# Patient Record
Sex: Female | Born: 1985 | Race: White | Hispanic: No | Marital: Married | Smoking: Never smoker
Health system: Southern US, Community
[De-identification: ages and names within clinical notes are randomized; demographics above are authoritative.]

---

## 2014-08-02 ENCOUNTER — Emergency Department (INDEPENDENT_AMBULATORY_CARE_PROVIDER_SITE_OTHER)
Admission: EM | Admit: 2014-08-02 | Discharge: 2014-08-02 | Disposition: A | Discharge status: Home / Self Care | Payer: No Typology Code available for payment source | Source: Home / Self Care | Attending: Family Medicine | Admitting: Family Medicine

## 2014-08-02 ENCOUNTER — Emergency Department (INDEPENDENT_AMBULATORY_CARE_PROVIDER_SITE_OTHER): Payer: No Typology Code available for payment source

## 2014-08-02 ENCOUNTER — Encounter: Payer: Self-pay | Admitting: Emergency Medicine

## 2014-08-02 DIAGNOSIS — S39012A Strain of muscle, fascia and tendon of lower back, initial encounter: Secondary | ICD-10-CM

## 2014-08-02 DIAGNOSIS — S335XXA Sprain of ligaments of lumbar spine, initial encounter: Secondary | ICD-10-CM

## 2014-08-02 DIAGNOSIS — M545 Low back pain, unspecified: Secondary | ICD-10-CM

## 2014-08-02 NOTE — ED Provider Notes (Signed)
CSN: [id number]     Arrival date & time 08/02/14  1748 History   First MD Initiated Contact with Patient 08/02/14 1808     Chief Complaint  Patient presents with  . Back Pain      HPI Comments: Early this morning patient bent over to pick up her 58 month old child and felt sudden pain in her lower back that has persisted.  The pain does not radiate.  No bowel or bladder dysfunction and no saddle numbness.  No fevers, chills, and sweats. She is presently breast feeding.   Patient is a 28 y.o. female presenting with back pain. The history is provided by the patient.  Back Pain Location:  Lumbar spine Quality:  Aching Radiates to:  Does not radiate Pain severity:  Moderate Pain is:  Same all the time Onset quality:  Sudden Duration:  8 hours Timing:  Constant Progression:  Unchanged Chronicity:  New Context comment:  Lifting infant Relieved by:  Nothing Worsened by:  Movement Ineffective treatments:  NSAIDs Associated symptoms: no abdominal pain, no bladder incontinence, no bowel incontinence, no dysuria, no fever, no leg pain, no numbness, no paresthesias, no pelvic pain, no perianal numbness, no tingling and no weakness   Risk factors: lack of exercise     History reviewed. No pertinent past medical history. History reviewed. No pertinent past surgical history. Family History  Problem Relation Age of Onset  . Stroke Maternal Aunt    History  Substance Use Topics  . Smoking status: Never Smoker   . Smokeless tobacco: Not on file  . Alcohol Use: No   OB History   Grav Para Term Preterm Abortions TAB SAB Ect Mult Living                 Review of Systems  Constitutional: Negative for fever.  Gastrointestinal: Negative for abdominal pain and bowel incontinence.  Genitourinary: Negative for bladder incontinence, dysuria and pelvic pain.  Musculoskeletal: Positive for back pain.  Neurological: Negative for tingling, weakness, numbness and paresthesias.  All other systems  reviewed and are negative.   Allergies  Amoxicillin; Penicillins; and Sulfa antibiotics  Home Medications   Prior to Admission medications   Medication Sig Start Date End Date Taking? Authorizing Provider  norethindrone-ethinyl estradiol-iron (ESTROSTEP FE,TILIA FE,TRI-LEGEST FE) 1-20/1-30/1-35 MG-MCG tablet Take 1 tablet by mouth daily.   Yes Historical Provider, MD   BP 102/67  Pulse 64  Temp(Src) 98.3 F (36.8 C) (Oral)  Resp 16  Ht 5\' 8"  (1.727 m)  Wt 180 lb (81.647 kg)  BMI 27.38 kg/m2  SpO2 98% Physical Exam  Nursing note and vitals reviewed. Constitutional: She is oriented to person, place, and time. She appears well-developed and well-nourished. No distress.  HENT:  Head: Normocephalic.  Mouth/Throat: Oropharynx is clear and moist.  Eyes: Conjunctivae are normal. Pupils are equal, round, and reactive to light.  Neck: Neck supple.  Cardiovascular: Normal heart sounds.   Pulmonary/Chest: Breath sounds normal.  Abdominal: There is no tenderness.  Musculoskeletal: She exhibits no edema.       Lumbar back: She exhibits decreased range of motion, tenderness and bony tenderness. She exhibits no swelling and no edema.       Back:  Back:  Can heel/toe walk and squat with some difficulty.   Decreased forward flexion.  Tenderness in the midline approximately L4 and below.  There is tenderness in the paraspinous muscles as noted on diagram, worse on the left.   Straight leg raising test  is negative.  Sitting knee extension test is negative.  Strength and sensation in the lower extremities is normal.  Patellar and achilles reflexes are normal   Lymphadenopathy:    She has no cervical adenopathy.  Neurological: She is alert and oriented to person, place, and time.  Skin: Skin is warm and dry. No rash noted.    ED Course  Procedures  none     Imaging Review Dg Lumbar Spine Complete  08/02/2014   CLINICAL DATA:  Low back pain after lifting injury.  EXAM: LUMBAR SPINE -  COMPLETE 4+ VIEW  COMPARISON:  None.  FINDINGS: Minimal S-shaped lumbar spine curvature. Suspect mild sclerosis involving the bilateral sacroiliac joints. Maintenance of vertebral body height and alignment. 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  IMPRESSION: No acute osseous abnormality.  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   Electronically Signed   By: Jeronimo GreavesKyle  Talbot M.D.   On: 08/02/2014 18:58     MDM   1. Low back strain, initial encounter     Apply ice pack for 20 to 30 minutes, 3 to 4 times daily  Continue until pain decreases.  Begin Ibuprofen 200mg , 4 tabs every 8 hours with food.  Begin back stretching and range of motion exercises in about 5 days.  Avoid bending over to lift. Followup with Dr. Rodney Langtonhomas Thekkekand[facility] ([facility]) if not improving about two weeks.     Linda HawStephen A Lawson Isabell, MD 08/06/14 [phone]

## 2014-08-02 NOTE — Discharge Instructions (Signed)
Apply ice pack for 20 to 30 minutes, 3 to 4 times daily  Continue until pain decreases.  Begin Ibuprofen 200mg , 4 tabs every 8 hours with food.  Begin back stretching and range of motion exercises in about 5 days.  Avoid bending over to lift.   Back Pain, Adult Low back pain is very common. About 1 in 5 people have back pain.The cause of low back pain is rarely dangerous. The pain often gets better over time.About half of people with a sudden onset of back pain feel better in just 2 weeks. About 8 in 10 people feel better by 6 weeks.  CAUSES Some common causes of back pain include:  Strain of the muscles or ligaments supporting the spine.  Wear and tear (degeneration) of the spinal discs.  Arthritis.  Direct injury to the back. DIAGNOSIS Most of the time, the direct cause of low back pain is not known.However, back pain can be treated effectively even when the exact cause of the pain is unknown.Answering your caregiver's questions about your overall health and symptoms is one of the most accurate ways to make sure the cause of your pain is not dangerous. If your caregiver needs more information, he or she may order lab work or imaging tests (X-rays or MRIs).However, even if imaging tests show changes in your back, this usually does not require surgery. HOME CARE INSTRUCTIONS For many people, back pain returns.Since low back pain is rarely dangerous, it is often a condition that people can learn to [facility]manageon their own.   Remain active. It is stressful on the back to sit or stand in one place. Do not sit, drive, or stand in one place for more than 30 minutes at a time. Take short walks on level surfaces as soon as pain allows.Try to increase the length of time you walk each day.  Do not stay in bed.Resting more than 1 or 2 days can delay your recovery.  Do not avoid exercise or work.Your body is made to move.It is not dangerous to be active, even though your back may hurt.Your back  will likely heal faster if you return to being active before your pain is gone.  Pay attention to your body when you bend and lift. Many people have less discomfortwhen lifting if they bend their knees, keep the load close to their bodies,and avoid twisting. Often, the most comfortable positions are those that put less stress on your recovering back.  Find a comfortable position to sleep. Use a firm mattress and lie on your side with your knees slightly bent. If you lie on your back, put a pillow under your knees.  Only take over-the-counter or prescription medicines as directed by your caregiver. Over-the-counter medicines to reduce pain and inflammation are often the most helpful.Your caregiver may prescribe muscle relaxant drugs.These medicines help dull your pain so you can more quickly return to your normal activities and healthy exercise.  Put ice on the injured area.  Put ice in a plastic bag.  Place a towel between your skin and the bag.  Leave the ice on for 15-20 minutes, 03-04 times a day for the first 2 to 3 days. After that, ice and heat may be alternated to reduce pain and spasms.  Ask your caregiver about trying back exercises and gentle massage. This may be of some benefit.  Avoid feeling anxious or stressed.Stress increases muscle tension and can worsen back pain.It is important to recognize when you are anxious or stressed  and learn ways to manage it.Exercise is a great option. SEEK MEDICAL CARE IF:  You have pain that is not relieved with rest or medicine.  You have pain that does not improve in 1 week.  You have new symptoms.  You are generally not feeling well. SEEK IMMEDIATE MEDICAL CARE IF:   You have pain that radiates from your back into your legs.  You develop new bowel or bladder control problems.  You have unusual weakness or numbness in your arms or legs.  You develop nausea or vomiting.  You develop abdominal pain.  You feel  faint. Document Released: 12/08/2005 Document Revised: 06/08/2012 Document Reviewed: 04/11/2014 [facility] Patient Information 2015 [place], [place]. This information is not intended to replace advice given to you by your health care provider. Make sure you discuss any questions you have with your health care provider.

## 2014-08-02 NOTE — ED Notes (Signed)
Bent over to pick up 5311 month old son this a.m. and pulled lower back; has taken ibuprofen.

## 2015-10-18 IMAGING — CR DG LUMBAR SPINE COMPLETE 4+V
5 series · 5 of 5 positions shown · non-contrast
Comparison: None.

CLINICAL DATA: Low back pain after lifting injury.

EXAM:
LUMBAR SPINE - COMPLETE 4+ VIEW

[view not recorded (1 of 5)]
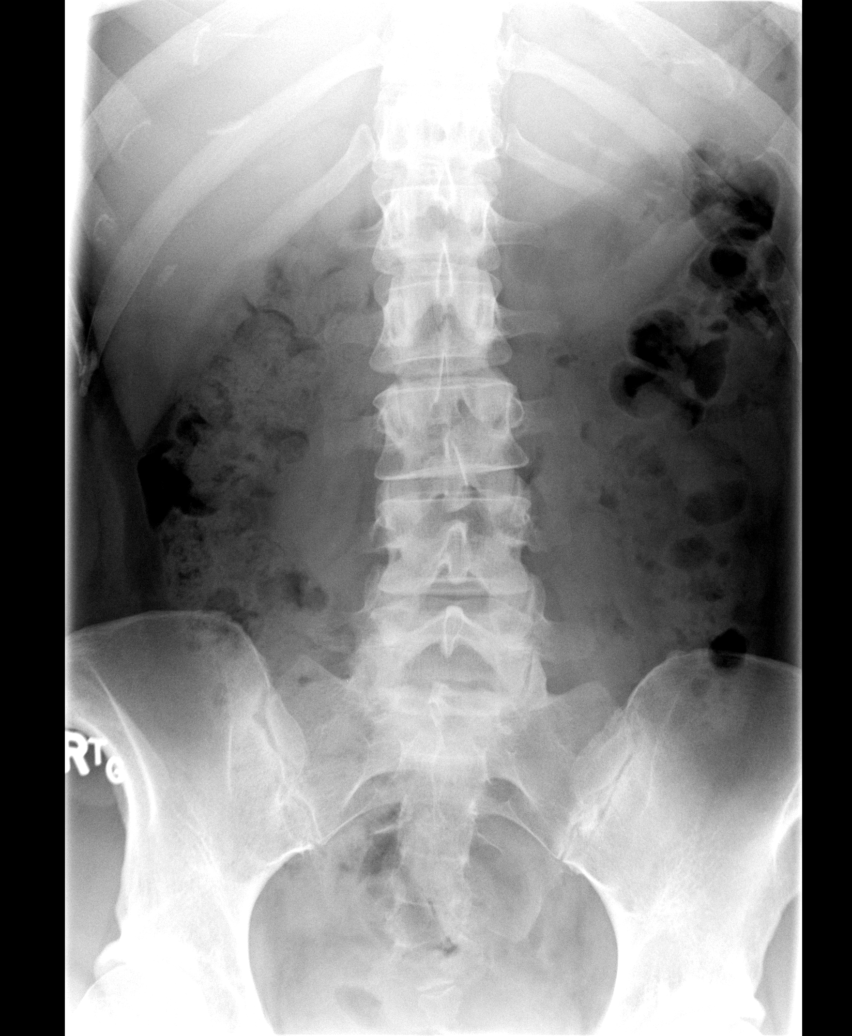

[view not recorded (2 of 5)]
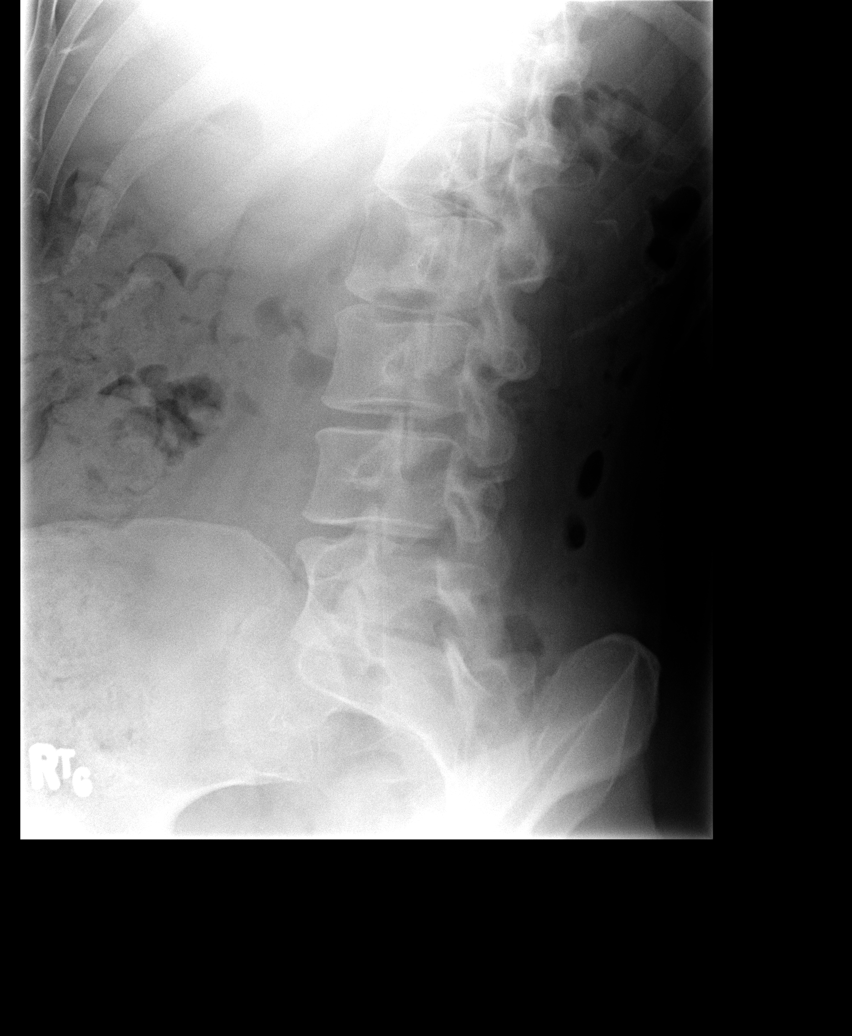

[view not recorded (3 of 5)]
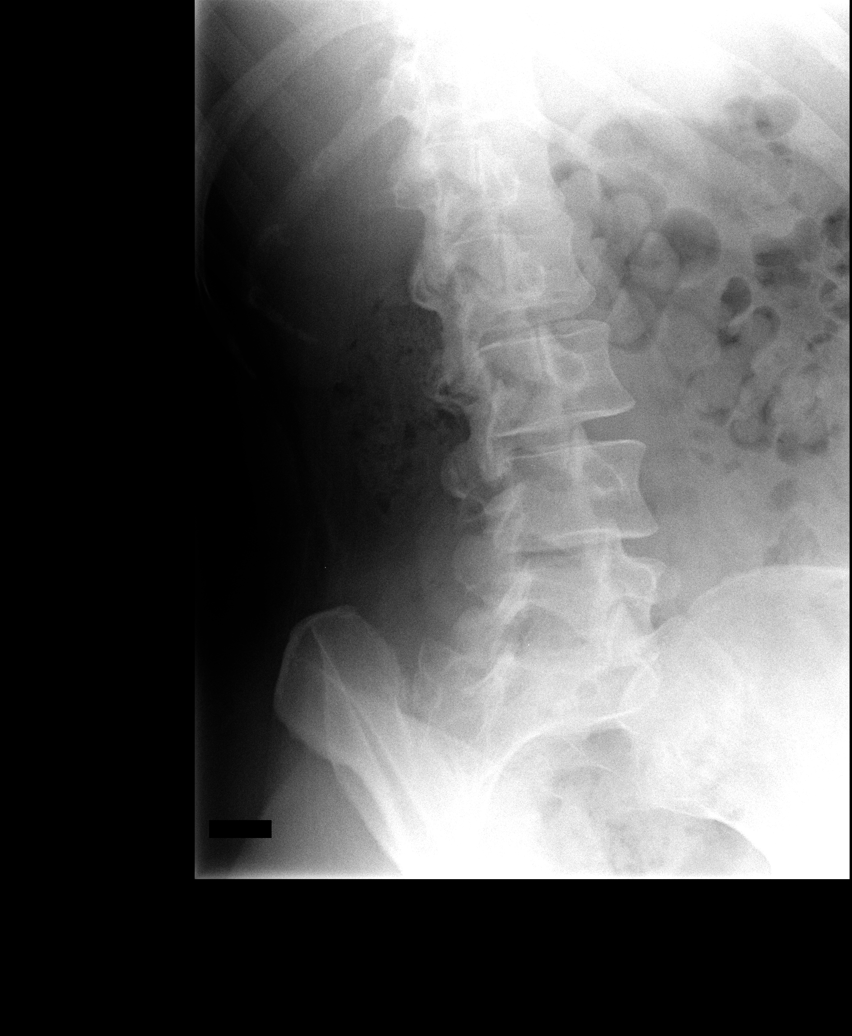

[view not recorded (4 of 5)]
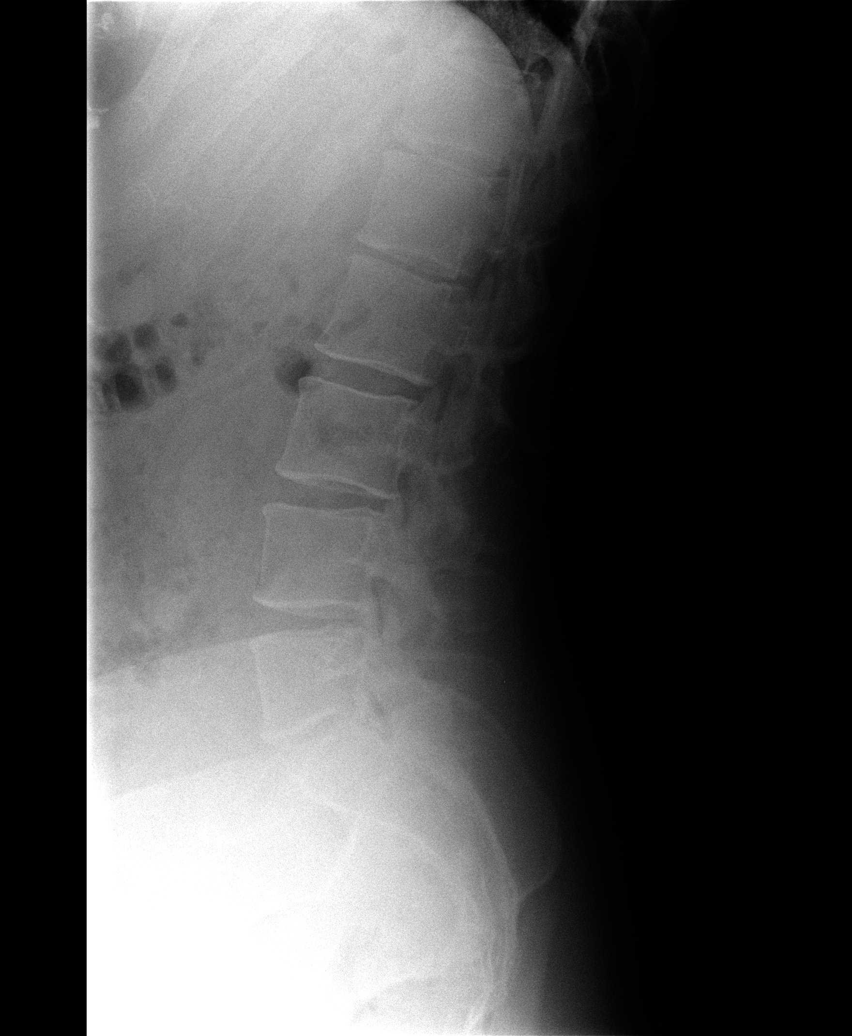

[view not recorded (5 of 5)]
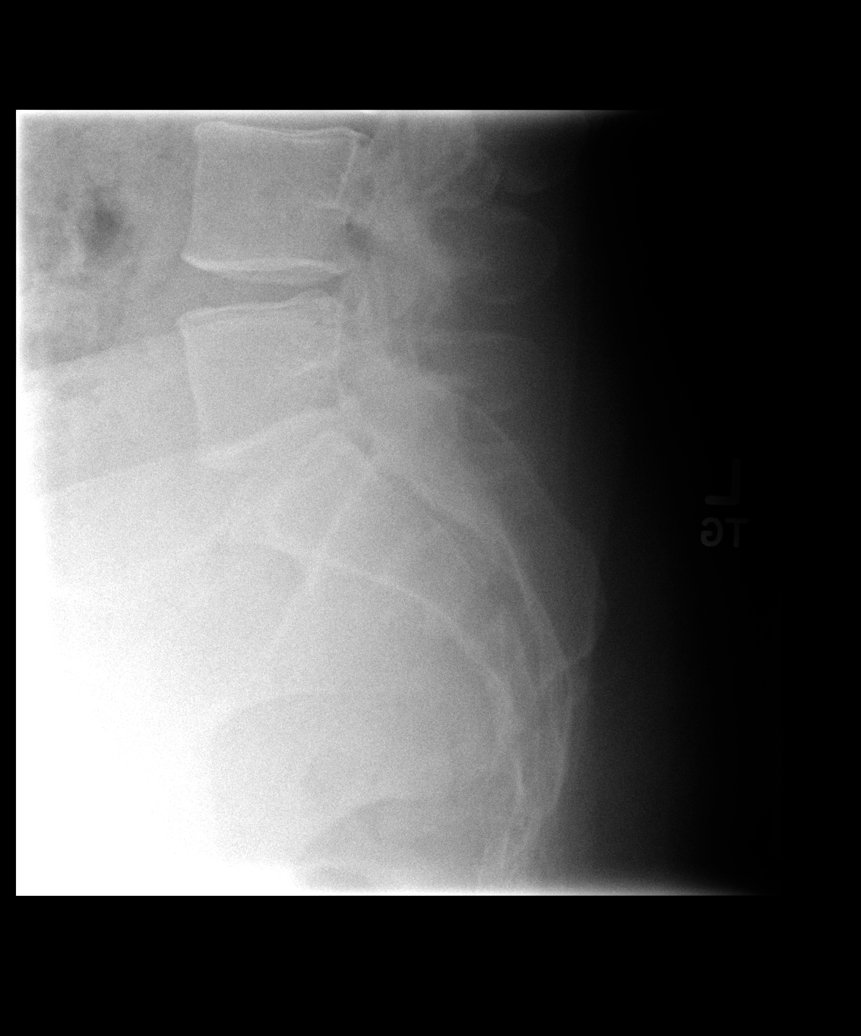

[5 of 5 positions shown; findings below may reference images not displayed]

FINDINGS: Minimal S-shaped lumbar spine curvature. Suspect mild sclerosis
involving the bilateral sacroiliac joints. Maintenance of vertebral
body height and alignment. Mild degenerative disc disease at the
lumbosacral junction.
IMPRESSION: No acute osseous abnormality.

Suspect mild sclerosis involving the sacroiliac joints. This is most
likely degenerative, age advanced.
